# Patient Record
Sex: Female | Born: 1950 | Race: Black or African American | Hispanic: No | Marital: Single | Smoking: Current every day smoker
Health system: Southern US, Community
[De-identification: ages and names within clinical notes are randomized; demographics above are authoritative.]

## PROBLEM LIST (undated history)

## (undated) DIAGNOSIS — I10 Essential (primary) hypertension: Secondary | ICD-10-CM

## (undated) DIAGNOSIS — E7800 Pure hypercholesterolemia, unspecified: Secondary | ICD-10-CM

## (undated) HISTORY — PX: ABDOMINAL HYSTERECTOMY: SHX81

---

## 1999-12-16 ENCOUNTER — Encounter: Payer: Self-pay | Admitting: Emergency Medicine

## 1999-12-16 ENCOUNTER — Emergency Department (HOSPITAL_COMMUNITY): Admission: EM | Admit: 1999-12-16 | Discharge: 1999-12-16 | Payer: Self-pay | Admitting: Emergency Medicine

## 2008-03-21 ENCOUNTER — Emergency Department (HOSPITAL_COMMUNITY): Admission: EM | Admit: 2008-03-21 | Discharge: 2008-03-21 | Payer: Self-pay | Admitting: Family Medicine

## 2014-03-07 ENCOUNTER — Emergency Department (HOSPITAL_COMMUNITY)
Admission: EM | Admit: 2014-03-07 | Discharge: 2014-03-07 | Disposition: A | Discharge status: Home / Self Care | Payer: Medicare HMO | Attending: Emergency Medicine | Admitting: Emergency Medicine

## 2014-03-07 ENCOUNTER — Encounter (HOSPITAL_COMMUNITY): Payer: Self-pay | Admitting: Family Medicine

## 2014-03-07 ENCOUNTER — Emergency Department (HOSPITAL_COMMUNITY): Payer: Medicare HMO

## 2014-03-07 DIAGNOSIS — Z7982 Long term (current) use of aspirin: Secondary | ICD-10-CM | POA: Insufficient documentation

## 2014-03-07 DIAGNOSIS — Z720 Tobacco use: Secondary | ICD-10-CM | POA: Diagnosis not present

## 2014-03-07 DIAGNOSIS — Z8639 Personal history of other endocrine, nutritional and metabolic disease: Secondary | ICD-10-CM | POA: Diagnosis not present

## 2014-03-07 DIAGNOSIS — I10 Essential (primary) hypertension: Secondary | ICD-10-CM | POA: Insufficient documentation

## 2014-03-07 DIAGNOSIS — R200 Anesthesia of skin: Secondary | ICD-10-CM | POA: Diagnosis present

## 2014-03-07 DIAGNOSIS — G510 Bell's palsy: Secondary | ICD-10-CM | POA: Insufficient documentation

## 2014-03-07 HISTORY — DX: Essential (primary) hypertension: I10

## 2014-03-07 HISTORY — DX: Pure hypercholesterolemia, unspecified: E78.00

## 2014-03-07 MED ORDER — VALACYCLOVIR HCL 1 G PO TABS: 1000.0000 mg | ORAL_TABLET | Freq: Three times a day (TID) | ORAL | Status: AC

## 2014-03-07 MED ORDER — PREDNISONE 20 MG PO TABS
60.0000 mg | ORAL_TABLET | Freq: Once | ORAL | Status: AC
  Administered 2014-03-07: 60 mg via ORAL
  Filled 2014-03-07: qty 3

## 2014-03-07 MED ORDER — VALACYCLOVIR HCL 500 MG PO TABS
1000.0000 mg | ORAL_TABLET | Freq: Once | ORAL | Status: AC
  Administered 2014-03-07: 1000 mg via ORAL
  Filled 2014-03-07: qty 2

## 2014-03-07 MED ORDER — PREDNISONE 20 MG PO TABS
60.0000 mg | ORAL_TABLET | Freq: Every day | ORAL | Status: AC
Start: 2014-03-07 — End: ?

## 2014-03-07 NOTE — ED Provider Notes (Signed)
CSN: [id number]     Arrival date & time 03/07/14  1021 History   First MD Initiated Contact with Patient 03/07/14 1113     Chief Complaint  Patient presents with  . Numbness     (Consider location/radiation/quality/duration/timing/severity/associated sxs/prior Treatment) HPI  Veronica Williamson is a 63 y.o. female complaining of left-sided facial droop associated with alteration in sensation to the left side of the face. Patient denies any change in her vision, taste sensation,  dysarthria, ataxia, weakness to upper and lower extremity. Onset this morning when she woke up, she went to bed in her normal state of health last night. Patient is active daily smoker, with hypertension and high cholesterol, she takes a daily aspirin, had her normal low dose this morning. Denies chest pain, shortness of breath, nausea, vomiting  Past Medical History  Diagnosis Date  . High cholesterol   . Hypertension    Past Surgical History  Procedure Laterality Date  . Abdominal hysterectomy     History reviewed. No pertinent family history. History  Substance Use Topics  . Smoking status: Current Every Day Smoker  . Smokeless tobacco: Not on file  . Alcohol Use: No   OB History    No data available     Review of Systems  10 systems reviewed and found to be negative, except as noted in the HPI.   Allergies  Review of patient's allergies indicates no known allergies.  Home Medications   Prior to Admission medications   Not on File   BP 143/79 mmHg  Pulse 102  Temp(Src) 98.2 F (36.8 C) (Oral)  Resp 20  Ht 5' (1.524 m)  Wt 186 lb (84.369 kg)  BMI 36.33 kg/m2  SpO2 100% Physical Exam  Constitutional: She is oriented to person, place, and time. She appears well-developed and well-nourished. No distress.  HENT:  Head: Normocephalic.  Mouth/Throat: Oropharynx is clear and moist.  Eyes: Conjunctivae and EOM are normal. Pupils are equal, round, and reactive to light.  Cardiovascular: Normal  rate.   Pulmonary/Chest: Effort normal. No stridor.  Musculoskeletal: Normal range of motion.  Neurological: She is alert and oriented to person, place, and time.  Left-sided facial droop, patient can raise her right eyebrow however the strength is reduced. Can fully close both eyes.   Sensation is equal bilaterally to face, states it is 100% to both upper and lower face however states it feels funny on the left.  II-Visual fields grossly intact. III/IV/VI-Extraocular movements intact.  Pupils reactive bilaterally. VIII- Hearing grossly intact IX/X-Normal gag XI-bilateral shoulder shrug XII-midline tongue extension Motor: 5/5 bilaterally with normal tone and bulk Cerebellar: Normal finger-to-nose  and normal heel-to-shin test.   Romberg negative Ambulates with a coordinated gait   Psychiatric: She has a normal mood and affect.  Nursing note and vitals reviewed.   ED Course  Procedures (including critical care time) Labs Review Labs Reviewed - No data to display  Imaging Review Ct Head Wo Contrast  03/07/2014   CLINICAL DATA:  Left side facial numbness starting this morning  EXAM: CT HEAD WITHOUT CONTRAST  TECHNIQUE: Contiguous axial images were obtained from the base of the skull through the vertex without intravenous contrast.  COMPARISON:  None.  FINDINGS: No skull fracture is noted. Paranasal sinuses and mastoid air cells are unremarkable.  No intracranial hemorrhage, mass effect or midline shift.  No acute cortical infarction. No mass lesion is noted on this unenhanced scan. The gray and white-matter differentiation is preserved. Mild atherosclerotic calcifications  of carotid siphon. There is right nasal septum deviation.  IMPRESSION: No acute intracranial abnormality. No definite acute cortical infarction.   Electronically Signed   By: Natasha MeadLiviu  Pop M.D.   On: 03/07/2014 12:37     EKG Interpretation None      MDM   Final diagnoses:  None    Filed Vitals:   03/07/14  1130 03/07/14 1142 03/07/14 1207 03/07/14 1230  BP: 142/81 142/81 145/78 132/74  Pulse: 90 85  86  Temp:      TempSrc:      Resp:  16  22  Height:      Weight:      SpO2: 100% 100%  100%    Medications  predniSONE (DELTASONE) tablet 60 mg (60 mg Oral Given 03/07/14 1314)  valACYclovir (VALTREX) tablet 1,000 mg (1,000 mg Oral Given 03/07/14 1314)    Veronica Williamson is a pleasant 63 y.o. female presenting with left facial droop and no overt sensory deficits. Patient can shut eye and eyebrow when concentrating. Neuro exam is otherwise nonfocal. Is a shared visit with the attending physician who is personally evaluated the patient, states that this is likely Bell's palsy, no indication for further workup in the ED. Patient's head CT is negative. Primary care physician is in [place], patient will be returning home in 3 days. Advised her she will need to follow with her primary care for continuation of steroid taper.  This is a shared visit with the attending physician who personally evaluated the patient and agrees with the care plan.   Evaluation does not show pathology that would require ongoing emergent intervention or inpatient treatment. Pt is hemodynamically stable and mentating appropriately. Discussed findings and plan with patient/guardian, who agrees with care plan. All questions answered. Return precautions discussed and outpatient follow up given.   New Prescriptions   PREDNISONE (DELTASONE) 20 MG TABLET    Take 3 tablets (60 mg total) by mouth daily.   VALACYCLOVIR (VALTREX) 1000 MG TABLET    Take 1 tablet (1,000 mg total) by mouth 3 (three) times daily.         Wynetta Emeryicole Axil Copeman, PA-C 03/07/14 1537  Gilda Creasehristopher J. Pollina, MD 03/08/14 [phone]

## 2014-03-07 NOTE — ED Notes (Signed)
Per pt sts woke up this am with left sided facial numbness and droop. Denies any numbness anywhere else or weakness.

## 2014-03-07 NOTE — ED Notes (Signed)
Dr Blinda Leatherwoodpollina in to see pt

## 2014-03-07 NOTE — Discharge Instructions (Signed)
Please follow with your primary care doctor in the next 2 days for a check-up. They must obtain records for further management.   Do not hesitate to return to the Emergency Department for any new, worsening or concerning symptoms.    Bell's Palsy Bell's palsy is a condition in which the muscles on one side of the face cannot move (paralysis). This is because the nerves in the face are paralyzed. It is most often thought to be caused by a virus. The virus causes swelling of the nerve that controls movement on one side of the face. The nerve travels through a tight space surrounded by bone. When the nerve swells, it can be compressed by the bone. This results in damage to the protective covering around the nerve. This damage interferes with how the nerve communicates with the muscles of the face. As a result, it can cause weakness or paralysis of the facial muscles.  Injury (trauma), tumor, and surgery may cause Bell's palsy, but most of the time the cause is unknown. It is a relatively common condition. It starts suddenly (abrupt onset) with the paralysis usually ending within 2 days. Bell's palsy is not dangerous. But because the eye does not close properly, you may need care to keep the eye from getting dry. This can include splinting (to keep the eye shut) or moistening with artificial tears. Bell's palsy very seldom occurs on both sides of the face at the same time. SYMPTOMS   Eyebrow sagging.  Drooping of the eyelid and corner of the mouth.  Inability to close one eye.  Loss of taste on the front of the tongue.  Sensitivity to loud noises. TREATMENT  The treatment is usually non-surgical. If the patient is seen within the first 24 to 48 hours, a short course of steroids may be prescribed, in an attempt to shorten the length of the condition. Antiviral medicines may also be used with the steroids, but it is unclear if they are helpful.  You will need to protect your eye, if you cannot close  it. The cornea (clear covering over your eye) will become dry and can be damaged. Artificial tears can be used to keep your eye moist. Glasses or an eye patch should be worn to protect your eye. PROGNOSIS  Recovery is variable, ranging from days to months. Although the problem usually goes away completely (about 80% of cases resolve), predicting the outcome is impossible. Most people improve within 3 weeks of when the symptoms began. Improvement may continue for 3 to 6 months. A small number of people have moderate to severe weakness that is permanent.  HOME CARE INSTRUCTIONS   If your caregiver prescribed medication to reduce swelling in the nerve, use as directed. Do not stop taking the medication unless directed by your caregiver.  Use moisturizing eye drops as needed to prevent drying of your eye, as directed by your caregiver.  Protect your eye, as directed by your caregiver.  Use facial massage and exercises, as directed by your caregiver.  Perform your normal activities, and get your normal rest. SEEK IMMEDIATE MEDICAL CARE IF:   There is pain, redness or irritation in the eye.  You or your child has an oral temperature above 102 F (38.9 C), not controlled by medicine. MAKE SURE YOU:   Understand these instructions.  Will watch your condition.  Will get help right away if you are not doing well or get worse. Document Released: 02/27/2005 Document Revised: 05/22/2011 Document Reviewed: 06/06/2013 [employer]  Patient Information ©2015 [employer]. This information is not intended to replace advice given to you by your health care provider. Make sure you discuss any questions you have with your health care provider. ° °

## 2014-03-07 NOTE — ED Provider Notes (Signed)
Patient presented to the ER with Left facial numbness. Patient reports waking up this morning with symptoms. She has no headache, arm/leg numbness or weakness, speech disturbance.  Face to face Exam: HEENT - PERRLA. Increased tearing, left eye Lungs - CTAB Heart - RRR, no M/R/G Abd - S/NT/ND Neuro - alert, oriented x3. Decreased movement left forehead and eyelid (unable to blink left eye)  Plan: Exam c/w bell's palsy, no concern for CVA based on exam (upper motor neuron defect is present)   Gilda Creasehristopher J. Pollina, MD 03/07/14 1221

## 2016-06-25 IMAGING — CT CT HEAD W/O CM
2 series · 16 of 30 positions shown, 18 images · non-contrast
Comparison: None.

CLINICAL DATA: Left side facial numbness starting this morning

EXAM:
CT HEAD WITHOUT CONTRAST
TECHNIQUE: Contiguous axial images were obtained from the base of the skull
through the vertex without intravenous contrast.

[Series 201: head w/o, idose (1) · axial · non-contrast · 0.49mm/px · z∈[+1201,+1316]mm · 8 of 31 slices shown, 10 images]
[im 4/31  brain]
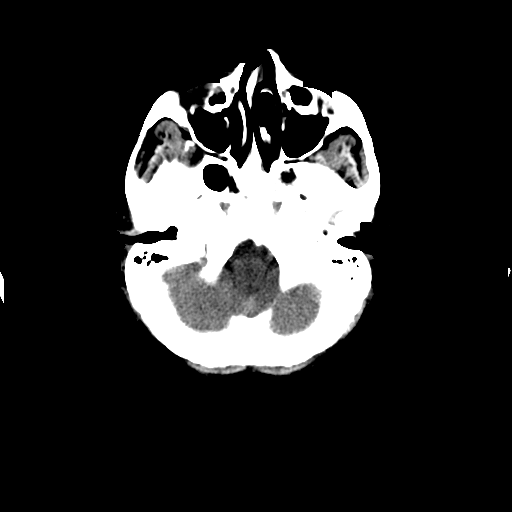
[im 4/31  bone]
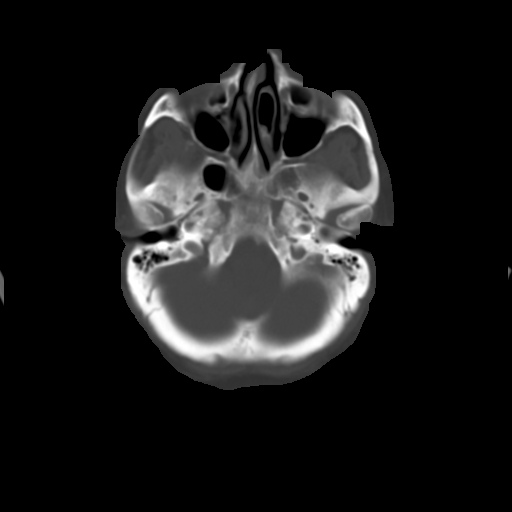
[im 7/31  brain]
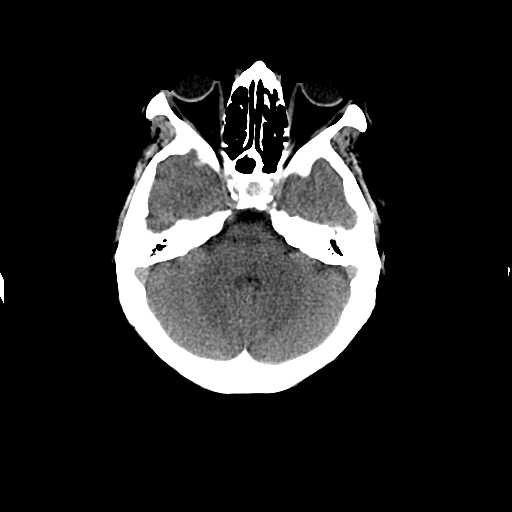
[im 11/31  brain]
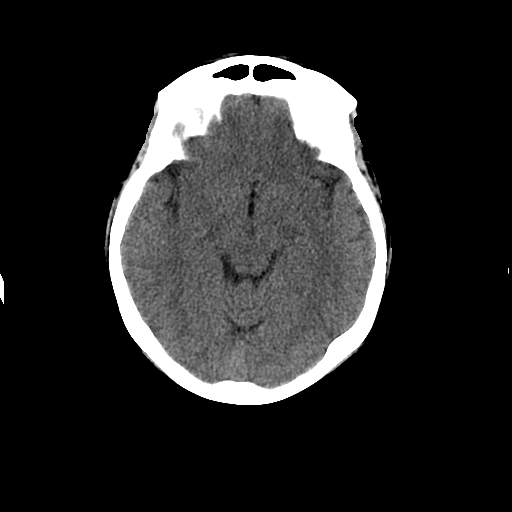
[im 14/31  brain]
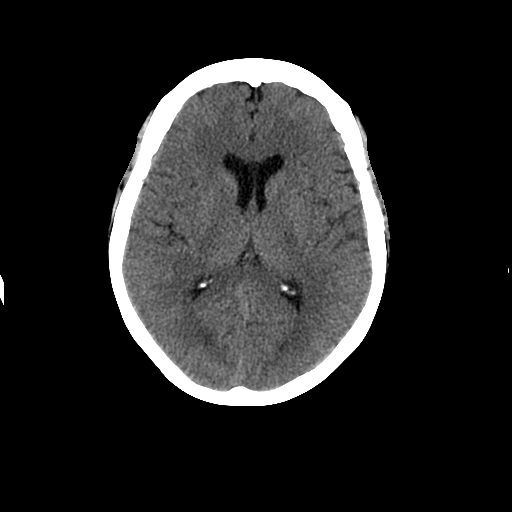
[im 17/31  brain]
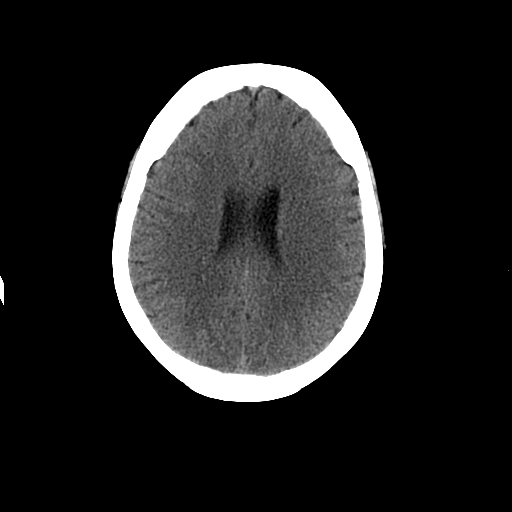
[im 17/31  bone]
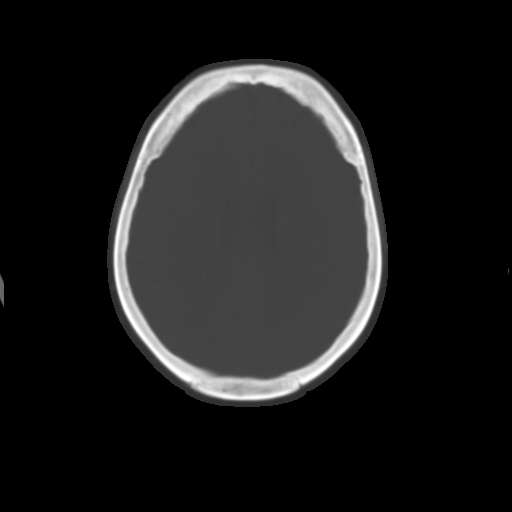
[im 21/31  brain]
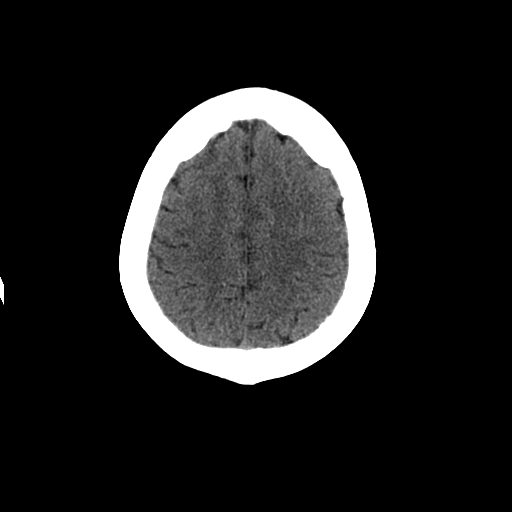
[im 24/31  brain]
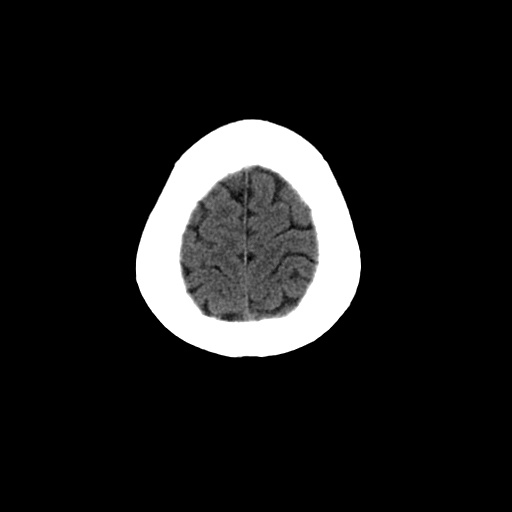
[im 27/31  brain]
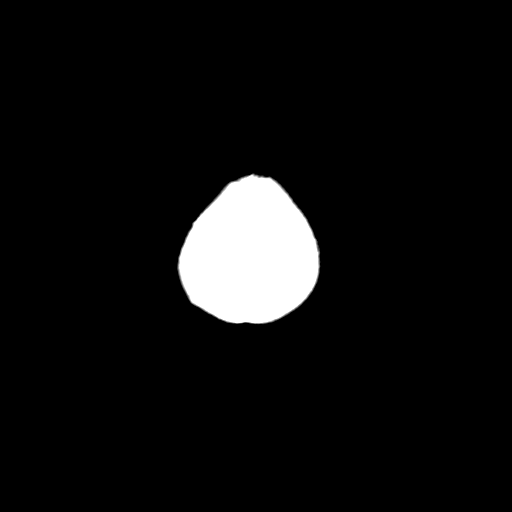

[Series 202: head w/o bone, idose (1) · axial · non-contrast · 0.49mm/px · z∈[+1200,+1320]mm · 8 of 62 slices shown]
[im 7/62  bone]
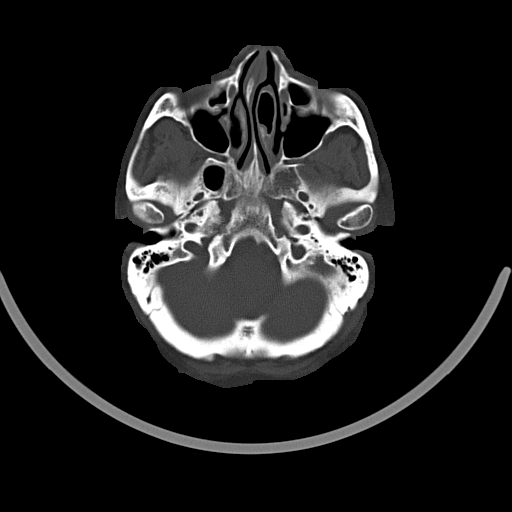
[im 13/62  bone]
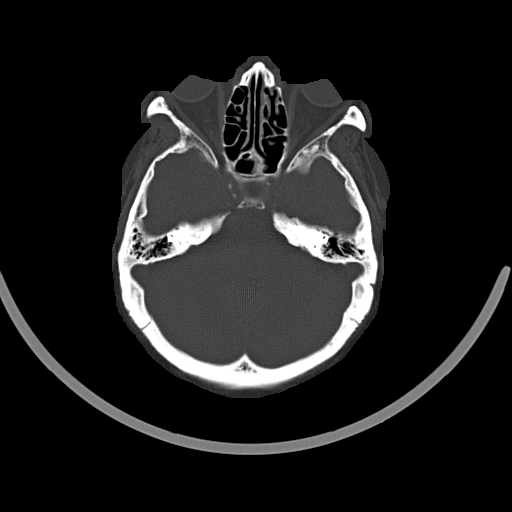
[im 20/62  bone]
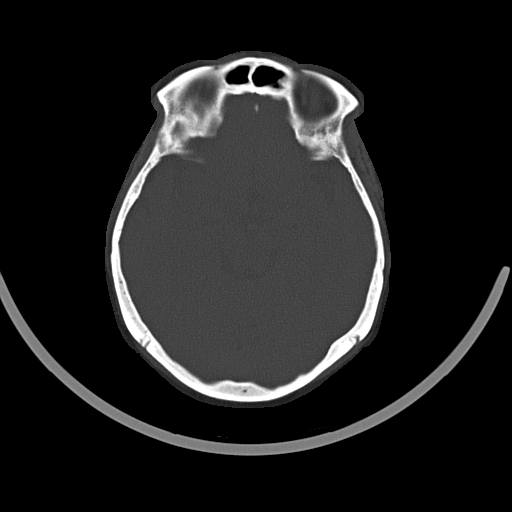
[im 26/62  bone]
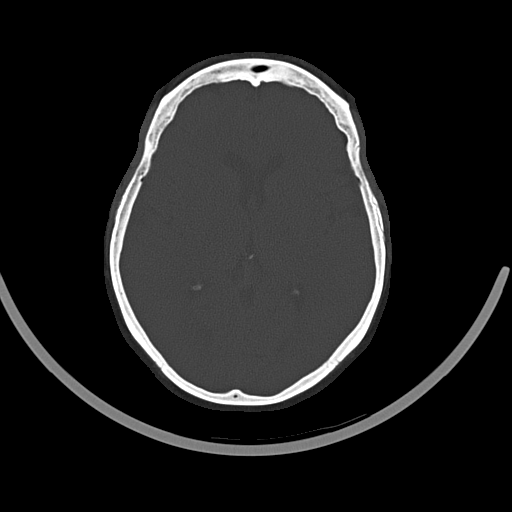
[im 36/62  bone]
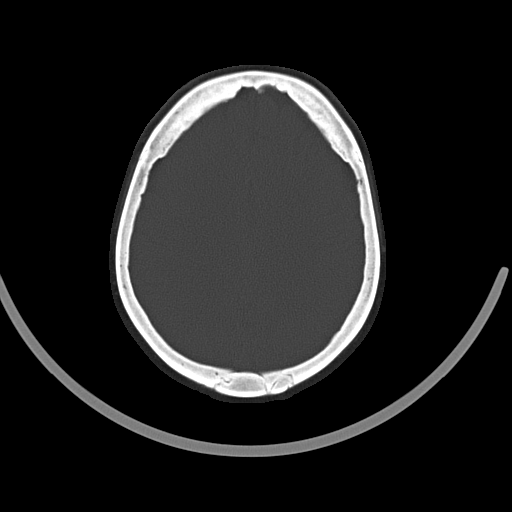
[im 42/62  bone]
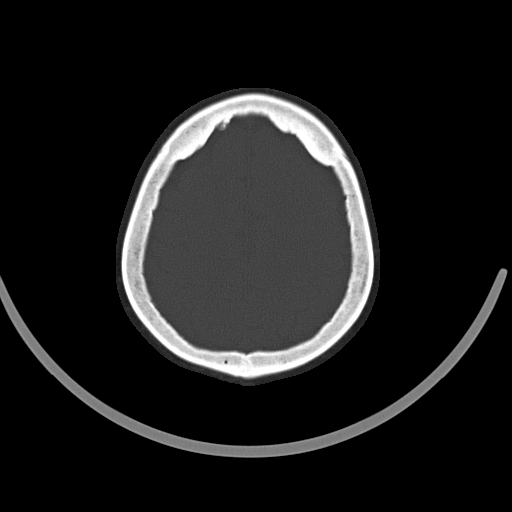
[im 49/62  bone]
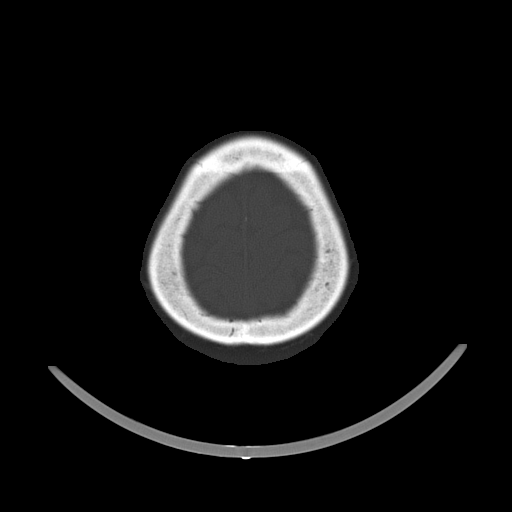
[im 55/62  bone]
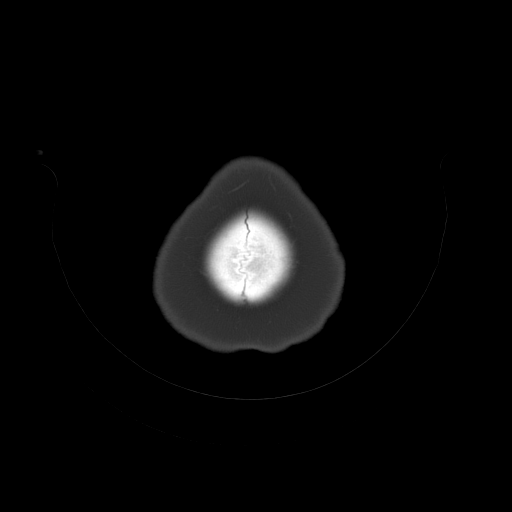

[16 of 30 positions shown; findings below may reference images not displayed]

FINDINGS: No skull fracture is noted. Paranasal sinuses and mastoid air cells
are unremarkable.

No intracranial hemorrhage, mass effect or midline shift.

No acute cortical infarction. No mass lesion is noted on this
unenhanced scan. The gray and white-matter differentiation is
preserved. Mild atherosclerotic calcifications of carotid siphon.
There is right nasal septum deviation.
IMPRESSION: No acute intracranial abnormality. No definite acute cortical
infarction.
